# Patient Record
Sex: Male | Born: 1965
Health system: Southern US, Community
[De-identification: ages and names within clinical notes are randomized; demographics above are authoritative.]

## PROBLEM LIST (undated history)

## (undated) DIAGNOSIS — E559 Vitamin D deficiency, unspecified: Secondary | ICD-10-CM

## (undated) DIAGNOSIS — K635 Polyp of colon: Secondary | ICD-10-CM

## (undated) DIAGNOSIS — K4090 Unilateral inguinal hernia, without obstruction or gangrene, not specified as recurrent: Secondary | ICD-10-CM

## (undated) HISTORY — DX: Polyp of colon: K63.5

## (undated) HISTORY — DX: Vitamin D deficiency, unspecified: E55.9

## (undated) HISTORY — PX: INGUINAL HERNIA REPAIR: SUR1180

## (undated) HISTORY — PX: OTHER SURGICAL HISTORY: SHX169

## (undated) HISTORY — DX: Unilateral inguinal hernia, without obstruction or gangrene, not specified as recurrent: K40.90

---

## 2005-07-12 ENCOUNTER — Emergency Department: Payer: Self-pay | Admitting: Internal Medicine

## 2006-09-18 ENCOUNTER — Emergency Department: Payer: Self-pay | Admitting: Emergency Medicine

## 2017-05-16 ENCOUNTER — Telehealth: Payer: Self-pay | Admitting: *Deleted

## 2017-05-16 NOTE — Telephone Encounter (Signed)
Copied from [place] [id number]. Topic: Appointment Scheduling - New Patient >> May 16, 2017 10:54 AM Synthia Innocent wrote: New patient has been scheduled for your office. Provider: Dr Terese Door Date of Appointment: 07/01/17  Route to department's PEC pool.

## 2017-07-01 ENCOUNTER — Ambulatory Visit (INDEPENDENT_AMBULATORY_CARE_PROVIDER_SITE_OTHER): Payer: BLUE CROSS/BLUE SHIELD | Admitting: Internal Medicine

## 2017-07-01 ENCOUNTER — Encounter: Payer: Self-pay | Admitting: Internal Medicine

## 2017-07-01 VITALS — BP 136/80 | HR 100 | Temp 98.3°F | Ht 70.0 in | Wt 222.4 lb

## 2017-07-01 DIAGNOSIS — K4090 Unilateral inguinal hernia, without obstruction or gangrene, not specified as recurrent: Secondary | ICD-10-CM | POA: Diagnosis not present

## 2017-07-01 DIAGNOSIS — Z0000 Encounter for general adult medical examination without abnormal findings: Secondary | ICD-10-CM | POA: Diagnosis not present

## 2017-07-01 DIAGNOSIS — Z13220 Encounter for screening for lipoid disorders: Secondary | ICD-10-CM | POA: Diagnosis not present

## 2017-07-01 DIAGNOSIS — Z1389 Encounter for screening for other disorder: Secondary | ICD-10-CM

## 2017-07-01 DIAGNOSIS — Z1329 Encounter for screening for other suspected endocrine disorder: Secondary | ICD-10-CM | POA: Diagnosis not present

## 2017-07-01 DIAGNOSIS — Z125 Encounter for screening for malignant neoplasm of prostate: Secondary | ICD-10-CM

## 2017-07-01 NOTE — Progress Notes (Signed)
Pre visit review using our clinic review tool, if applicable. No additional management support is needed unless otherwise documented below in the visit note. 

## 2017-07-01 NOTE — Patient Instructions (Addendum)
Dr. Cheryln Manly [facility] Hernia  [place]   [phone]  [phone]  [facility] [id number] [place] [address]   Think about labs  CMET $21.99  CBC $14  Lipid $51.00  Urine $10 TSH $26  PSA $58    Hernia, Adult A hernia is the bulging of an organ or tissue through a weak spot in the muscles of the abdomen (abdominal wall). Hernias develop most often near the navel or groin. There are many kinds of hernias. Common kinds include:  Femoral hernia. This kind of hernia develops under the groin in the upper thigh area.  Inguinal hernia. This kind of hernia develops in the groin or scrotum.  Umbilical hernia. This kind of hernia develops near the navel.  Hiatal hernia. This kind of hernia causes part of the stomach to be pushed up into the chest.  Incisional hernia. This kind of hernia bulges through a scar from an abdominal surgery.  What are the causes? This condition may be caused by:  Heavy lifting.  Coughing over a long period of time.  Straining to have a bowel movement.  An incision made during an abdominal surgery.  A birth defect (congenital defect).  Excess weight or obesity.  Smoking.  Poor nutrition.  Cystic fibrosis.  Excess fluid in the abdomen.  Undescended testicles.  What are the signs or symptoms? Symptoms of a hernia include:  A lump on the abdomen. This is the first sign of a hernia. The lump may become more obvious with standing, straining, or coughing. It may get bigger over time if it is not treated or if the condition causing it is not treated.  Pain. A hernia is usually painless, but it may become painful over time if treatment is delayed. The pain is usually dull and may get worse with standing or lifting heavy objects.  Sometimes a hernia gets tightly squeezed in the weak spot (strangulated) or stuck there (incarcerated) and causes additional symptoms. These symptoms may  include:  Vomiting.  Nausea.  Constipation.  Irritability.  How is this diagnosed? A hernia may be diagnosed with:  A physical exam. During the exam your health care provider may ask you to cough or to make a specific movement, because a hernia is usually more visible when you move.  Imaging tests. These can include: ? X-rays. ? Ultrasound. ? CT scan.  How is this treated? A hernia that is small and painless may not need to be treated. A hernia that is large or painful may be treated with surgery. Inguinal hernias may be treated with surgery to prevent incarceration or strangulation. Strangulated hernias are always treated with surgery, because lack of blood to the trapped organ or tissue can cause it to die. Surgery to treat a hernia involves pushing the bulge back into place and repairing the weak part of the abdomen. Follow these instructions at home:  Avoid straining.  Do not lift anything heavier than 10 lb (4.5 kg).  Lift with your leg muscles, not your back muscles. This helps avoid strain.  When coughing, try to cough gently.  Prevent constipation. Constipation leads to straining with bowel movements, which can make a hernia worse or cause a hernia repair to break down. You can prevent constipation by: ? Eating a high-fiber diet that includes plenty of fruits and vegetables. ? Drinking enough fluids to keep your urine clear or pale yellow. Aim to drink 6-8  glasses of water per day. ? Using a stool softener as directed by your health care provider.  Lose weight, if you are overweight.  Do not use any tobacco products, including cigarettes, chewing tobacco, or electronic cigarettes. If you need help quitting, ask your health care provider.  Keep all follow-up visits as directed by your health care provider. This is important. Your health care provider may need to monitor your condition. Contact a health care provider if:  You have swelling, redness, and pain in the  affected area.  Your bowel habits change. Get help right away if:  You have a fever.  You have abdominal pain that is getting worse.  You feel nauseous or you vomit.  You cannot push the hernia back in place by gently pressing on it while you are lying down.  The hernia: ? Changes in shape or size. ? Is stuck outside the abdomen. ? Becomes discolored. ? Feels hard or tender. This information is not intended to replace advice given to you by your health care provider. Make sure you discuss any questions you have with your health care provider. Document Released: 02/25/2005 Document Revised: 07/26/2015 Document Reviewed: 01/05/2014 Elsevier Interactive Patient Education  2017 [place].   Colonoscopy, Adult A colonoscopy is an exam to look at the entire large intestine. During the exam, a lubricated, bendable tube is inserted into the anus and then passed into the rectum, colon, and other parts of the large intestine. A colonoscopy is often done as a part of normal colorectal screening or in response to certain symptoms, such as anemia, persistent diarrhea, abdominal pain, and blood in the stool. The exam can help screen for and diagnose medical problems, including:  Tumors.  Polyps.  Inflammation.  Areas of bleeding.  Tell a health care provider about:  Any allergies you have.  All medicines you are taking, including vitamins, herbs, eye drops, creams, and over-the-counter medicines.  Any problems you or family members have had with anesthetic medicines.  Any blood disorders you have.  Any surgeries you have had.  Any medical conditions you have.  Any problems you have had passing stool. What are the risks? Generally, this is a safe procedure. However, problems may occur, including:  Bleeding.  A tear in the intestine.  A reaction to medicines given during the exam.  Infection (rare).  What happens before the procedure? Eating and drinking  restrictions Follow instructions from your health care provider about eating and drinking, which may include:  A few days before the procedure - follow a low-fiber diet. Avoid nuts, seeds, dried fruit, raw fruits, and vegetables.  1-3 days before the procedure - follow a clear liquid diet. Drink only clear liquids, such as clear broth or bouillon, black coffee or tea, clear juice, clear soft drinks or sports drinks, gelatin dessert, and popsicles. Avoid any liquids that contain red or purple dye.  On the day of the procedure - do not eat or drink anything during the 2 hours before the procedure, or within the time period that your health care provider recommends.  Bowel prep If you were prescribed an oral bowel prep to clean out your colon:  Take it as told by your health care provider. Starting the day before your procedure, you will need to drink a large amount of medicated liquid. The liquid will cause you to have multiple loose stools until your stool is almost clear or light green.  If your skin or anus gets irritated from diarrhea,  you may use these to relieve the irritation: ? Medicated wipes, such as adult wet wipes with aloe and vitamin E. ? A skin soothing-product like petroleum jelly.  If you vomit while drinking the bowel prep, take a break for up to 60 minutes and then begin the bowel prep again. If vomiting continues and you cannot take the bowel prep without vomiting, call your health care provider.  General instructions  Ask your health care provider about changing or stopping your regular medicines. This is especially important if you are taking diabetes medicines or blood thinners.  Plan to have someone take you home from the hospital or clinic. What happens during the procedure?  An IV tube may be inserted into one of your veins.  You will be given medicine to help you relax (sedative).  To reduce your risk of infection: ? Your health care team will wash or sanitize  their hands. ? Your anal area will be washed with soap.  You will be asked to lie on your side with your knees bent.  Your health care provider will lubricate a long, thin, flexible tube. The tube will have a camera and a light on the end.  The tube will be inserted into your anus.  The tube will be gently eased through your rectum and colon.  Air will be delivered into your colon to keep it open. You may feel some pressure or cramping.  The camera will be used to take images during the procedure.  A small tissue sample may be removed from your body to be examined under a microscope (biopsy). If any potential problems are found, the tissue will be sent to a lab for testing.  If small polyps are found, your health care provider may remove them and have them checked for cancer cells.  The tube that was inserted into your anus will be slowly removed. The procedure may vary among health care providers and hospitals. What happens after the procedure?  Your blood pressure, heart rate, breathing rate, and blood oxygen level will be monitored until the medicines you were given have worn off.  Do not drive for 24 hours after the exam.  You may have a small amount of blood in your stool.  You may pass gas and have mild abdominal cramping or bloating due to the air that was used to inflate your colon during the exam.  It is up to you to get the results of your procedure. Ask your health care provider, or the department performing the procedure, when your results will be ready. This information is not intended to replace advice given to you by your health care provider. Make sure you discuss any questions you have with your health care provider. Document Released: 02/23/2000 Document Revised: 12/27/2015 Document Reviewed: 05/09/2015 Elsevier Interactive Patient Education  2018 [employer].

## 2017-07-01 NOTE — Progress Notes (Signed)
Chief Complaint  Patient presents with  . New Patient (Initial Visit)   New patient  He reports 1995 he had a bulge in the right groin s/p hernia repair with mesh now 6-7 month ago had bulge in left groin noted with heavy lifting and increasing in size and swollen. He feels gas moving through there with carbonated beverages and also hears gas in there. He wants referral to [facility] surgery for repair and funds are limited with insurance with high deductible   Review of Systems  Constitutional: Negative for weight loss.  HENT: Negative for hearing loss.   Eyes: Negative for blurred vision.  Respiratory: Negative for shortness of breath.   Cardiovascular: Negative for chest pain.  Musculoskeletal:       +hernia left  Skin: Negative for rash.  Neurological: Negative for headaches.  Psychiatric/Behavioral: Negative for depression.   Past Medical History:  Diagnosis Date  . Hernia, inguinal, right    Past Surgical History:  Procedure Laterality Date  . right inguinal hernia repair     1995   Family History  Problem Relation Age of Onset  . Arthritis Mother   . Depression Mother   . Cancer Maternal Grandfather    Social History   Socioeconomic History  . Marital status: Married    Spouse name: Not on file  . Number of children: Not on file  . Years of education: Not on file  . Highest education level: Not on file  Occupational History  . Not on file  Social Needs  . Financial resource strain: Not on file  . Food insecurity:    Worry: Not on file    Inability: Not on file  . Transportation needs:    Medical: Not on file    Non-medical: Not on file  Tobacco Use  . Smoking status: Never Smoker  . Smokeless tobacco: Never Used  Substance and Sexual Activity  . Alcohol use: Not Currently  . Drug use: Not Currently  . Sexual activity: Not Currently    Partners: Female  Lifestyle  . Physical activity:    Days per week: Not on file    Minutes per session: Not on file  .  Stress: Not on file  Relationships  . Social connections:    Talks on phone: Not on file    Gets together: Not on file    Attends religious service: Not on file    Active member of club or organization: Not on file    Attends meetings of clubs or organizations: Not on file    Relationship status: Not on file  . Intimate partner violence:    Fear of current or ex partner: Not on file    Emotionally abused: Not on file    Physically abused: Not on file    Forced sexual activity: Not on file  Other Topics Concern  . Not on file  Social History Narrative   College ed   Works Freight forwarder at [employer]    Married 1 adopted kid and triplets; 1 if daughters is Therapist, sports at [employer]    Never smoker/chew    Safe in relationship, wears seat belt    No guns    No outpatient medications have been marked as taking for the 07/01/17 encounter (Office Visit) with McLean-Scocuzza, Nino Glow, MD.   No Known Allergies No results found for this or any previous visit (from the past 2160 hour(s)). Objective  Body mass index is 31.91 kg/m. Wt Readings from Last  3 Encounters:  07/01/17 222 lb 6.4 oz (100.9 kg)   Temp Readings from Last 3 Encounters:  07/01/17 98.3 F (36.8 C) (Oral)   BP Readings from Last 3 Encounters:  07/01/17 136/80   Pulse Readings from Last 3 Encounters:  07/01/17 100    Physical Exam  Constitutional: He is oriented to person, place, and time. Vital signs are normal. He appears well-developed and well-nourished. He is cooperative.  HENT:  Head: Normocephalic and atraumatic.  Mouth/Throat: Oropharynx is clear and moist.  Eyes: Pupils are equal, round, and reactive to light. Conjunctivae are normal.  Cardiovascular: Normal rate, regular rhythm and normal heart sounds.  Pulmonary/Chest: Effort normal and breath sounds normal.  Abdominal:  +left groin inguinal hernia large   Neurological: He is alert and oriented to person, place, and time. Gait normal.  Skin: Skin is warm, dry  and intact.     Psychiatric: He has a normal mood and affect. His speech is normal and behavior is normal. Judgment and thought content normal. Cognition and memory are normal.  Nursing note and vitals reviewed.   Assessment   1. Left inguinal hernia  2. HM  Plan   1. Refer to Dr. Cheryln Manly [facility] hernia center  2.  Does not get flu shot  Tdap had 11/2008 Disc labs and given price list CMET, CBC, lipid, UA, TSH, PSA given cost  Disc colonoscopy not ready yet 2/2 cost  [facility]  Never smoker/chew     Provider: Dr. Olivia Mackie McLean-Scocuzza-Internal Medicine

## 2017-07-03 ENCOUNTER — Telehealth: Payer: Self-pay

## 2017-07-03 NOTE — Telephone Encounter (Signed)
Copied from [place]. Topic: Referral - Status >> Jul 03, 2017  2:40 PM Conception Chancy, NT wrote: Reason for CRM: patient is calling and states that the hernia clinic is needing the referral faxed to them FAX# [phone]

## 2017-07-04 NOTE — Telephone Encounter (Signed)
This was submitted to them through [facility] on 4/25

## 2017-07-07 ENCOUNTER — Telehealth: Payer: Self-pay

## 2017-07-07 NOTE — Telephone Encounter (Signed)
Referral has been refaxed to [facility] general surgery. Fax: [phone].  Copied from [place]. Topic: Referral - Status >> Jul 03, 2017  2:40 PM Conception Chancy, NT wrote: Reason for CRM: patient is calling and states that the hernia clinic is needing the referral faxed to them FAX# [phone]  >> Jul 07, 2017 11:13 AM Yvette Rack wrote: Patient states that the referral need to be faxed to the hernia clinic is  the referral fax to  [phone]  The clinic states that its quicker this way not through    This was submitted to them through [facility] link on 4/25

## 2017-07-15 ENCOUNTER — Telehealth: Payer: Self-pay

## 2017-07-15 NOTE — Telephone Encounter (Signed)
Copied from [place]. Topic: Referral - Status >> Jul 03, 2017  2:40 PM Conception Chancy, NT wrote: Reason for CRM: patient is calling and states that the hernia clinic is needing the referral faxed to them FAX# [phone]  >> Jul 07, 2017 11:13 AM Yvette Rack wrote: Patient states that the referral need to be faxed to the hernia clinic is  the referral fax to  [phone]  The clinic states that its quicker this way not through    This was submitted to them through [facility] link on 4/25                         Last office note has been faxed to number listed for [facility] hernia clinic  [phone]                 >> Jul 15, 2017  4:08 PM Vernona Rieger wrote: Patient states the hernia clinic in [place] called and said that they need History and Procedure report before they could see him. Please advise. Call back is [phone]

## 2017-11-17 ENCOUNTER — Other Ambulatory Visit: Payer: Self-pay

## 2017-11-17 ENCOUNTER — Ambulatory Visit (INDEPENDENT_AMBULATORY_CARE_PROVIDER_SITE_OTHER): Payer: BLUE CROSS/BLUE SHIELD | Admitting: Internal Medicine

## 2017-11-17 ENCOUNTER — Ambulatory Visit (INDEPENDENT_AMBULATORY_CARE_PROVIDER_SITE_OTHER): Payer: BLUE CROSS/BLUE SHIELD

## 2017-11-17 ENCOUNTER — Encounter: Payer: Self-pay | Admitting: Internal Medicine

## 2017-11-17 VITALS — BP 136/64 | HR 83 | Temp 98.3°F | Ht 70.0 in | Wt 217.0 lb

## 2017-11-17 DIAGNOSIS — Z1211 Encounter for screening for malignant neoplasm of colon: Secondary | ICD-10-CM

## 2017-11-17 DIAGNOSIS — E559 Vitamin D deficiency, unspecified: Secondary | ICD-10-CM

## 2017-11-17 DIAGNOSIS — Z0000 Encounter for general adult medical examination without abnormal findings: Secondary | ICD-10-CM

## 2017-11-17 DIAGNOSIS — Z1389 Encounter for screening for other disorder: Secondary | ICD-10-CM

## 2017-11-17 DIAGNOSIS — M549 Dorsalgia, unspecified: Secondary | ICD-10-CM

## 2017-11-17 DIAGNOSIS — Z125 Encounter for screening for malignant neoplasm of prostate: Secondary | ICD-10-CM

## 2017-11-17 DIAGNOSIS — Z13220 Encounter for screening for lipoid disorders: Secondary | ICD-10-CM

## 2017-11-17 DIAGNOSIS — Z0184 Encounter for antibody response examination: Secondary | ICD-10-CM

## 2017-11-17 DIAGNOSIS — Z1159 Encounter for screening for other viral diseases: Secondary | ICD-10-CM

## 2017-11-17 DIAGNOSIS — Z1329 Encounter for screening for other suspected endocrine disorder: Secondary | ICD-10-CM

## 2017-11-17 NOTE — Progress Notes (Signed)
Pre visit review using our clinic review tool, if applicable. No additional management support is needed unless otherwise documented below in the visit note. 

## 2017-11-17 NOTE — Progress Notes (Signed)
Chief Complaint  Patient presents with  . Back Pain   F/u  1. Mid back pain x 15 years intermittently but worse recently 6/10 nothing tried. He had fall 15 years ago and hit back middle but having trouble sleeping 2/2 pain no meds tried.  2. Wants colonoscopy   Review of Systems  Constitutional: Negative for weight loss.  HENT: Negative for hearing loss.   Eyes: Negative for blurred vision.  Respiratory: Negative for shortness of breath.   Cardiovascular: Negative for chest pain.  Gastrointestinal: Negative for abdominal pain.  Musculoskeletal: Positive for back pain.  Skin: Negative for rash.  Neurological: Negative for headaches.  Psychiatric/Behavioral: Negative for depression.   Past Medical History:  Diagnosis Date  . Hernia, inguinal, right    Past Surgical History:  Procedure Laterality Date  . right inguinal hernia repair     1995   Family History  Problem Relation Age of Onset  . Arthritis Mother   . Depression Mother   . Cancer Maternal Grandfather    Social History   Socioeconomic History  . Marital status: Married    Spouse name: Not on file  . Number of children: Not on file  . Years of education: Not on file  . Highest education level: Not on file  Occupational History  . Not on file  Social Needs  . Financial resource strain: Not on file  . Food insecurity:    Worry: Not on file    Inability: Not on file  . Transportation needs:    Medical: Not on file    Non-medical: Not on file  Tobacco Use  . Smoking status: Never Smoker  . Smokeless tobacco: Never Used  Substance and Sexual Activity  . Alcohol use: Not Currently  . Drug use: Not Currently  . Sexual activity: Not Currently    Partners: Female  Lifestyle  . Physical activity:    Days per week: Not on file    Minutes per session: Not on file  . Stress: Not on file  Relationships  . Social connections:    Talks on phone: Not on file    Gets together: Not on file    Attends religious  service: Not on file    Active member of club or organization: Not on file    Attends meetings of clubs or organizations: Not on file    Relationship status: Not on file  . Intimate partner violence:    Fear of current or ex partner: Not on file    Emotionally abused: Not on file    Physically abused: Not on file    Forced sexual activity: Not on file  Other Topics Concern  . Not on file  Social History Narrative   College ed   Works Freight forwarder at [employer]    Married 1 adopted kid and triplets; 1 if daughters is Therapist, sports at [employer]    Never smoker/chew    Safe in relationship, wears seat belt    No guns    No outpatient medications have been marked as taking for the 11/17/17 encounter (Office Visit) with McLean-Scocuzza, Nino Glow, MD.   No Known Allergies No results found for this or any previous visit (from the past 2160 hour(s)). Objective  Body mass index is 31.14 kg/m. Wt Readings from Last 3 Encounters:  11/17/17 217 lb (98.4 kg)  07/01/17 222 lb 6.4 oz (100.9 kg)   Temp Readings from Last 3 Encounters:  11/17/17 98.3 F (36.8 C) (Oral)  07/01/17 98.3 F (36.8 C) (Oral)   BP Readings from Last 3 Encounters:  11/17/17 136/64  07/01/17 136/80   Pulse Readings from Last 3 Encounters:  11/17/17 83  07/01/17 100    Physical Exam  Constitutional: He is oriented to person, place, and time. Vital signs are normal. He appears well-developed and well-nourished. He is cooperative.  HENT:  Head: Normocephalic and atraumatic.  Mouth/Throat: Oropharynx is clear and moist and mucous membranes are normal.  Eyes: Pupils are equal, round, and reactive to light. Conjunctivae are normal.  Cardiovascular: Normal rate, regular rhythm and normal heart sounds.  Pulmonary/Chest: Effort normal and breath sounds normal.  Musculoskeletal:       Thoracic back: He exhibits tenderness.  Neurological: He is alert and oriented to person, place, and time. Gait normal.  Skin: Skin is warm, dry and  intact.  Psychiatric: He has a normal mood and affect. His speech is normal and behavior is normal. Judgment and thought content normal. Cognition and memory are normal.  Nursing note and vitals reviewed.   Assessment   1. Mid back pain  2. HM Plan  1. Xray mid back  2.  Declines flu shot  Tdap due 11/2018  Will disc shingrix in future  Never smoker no etoh  Declines HIV   Referred colonoscopy Dr. Allen Norris pt prefers needs before 03/2018  Dermatology saw Dr. Raliegh Ip 4 years ago   Provider: Dr. Olivia Mackie McLean-Scocuzza-Internal Medicine

## 2017-11-17 NOTE — Patient Instructions (Addendum)
Dr. Rob Bunting colonoscopy  [facility]    Exercising to Lose Weight Exercising can help you to lose weight. In order to lose weight through exercise, you need to do vigorous-intensity exercise. You can tell that you are exercising with vigorous intensity if you are breathing very hard and fast and cannot hold a conversation while exercising. Moderate-intensity exercise helps to maintain your current weight. You can tell that you are exercising at a moderate level if you have a higher heart rate and faster breathing, but you are still able to hold a conversation. How often should I exercise? Choose an activity that you enjoy and set realistic goals. Your health care provider can help you to make an activity plan that works for you. Exercise regularly as directed by your health care provider. This may include:  Doing resistance training twice each week, such as: ? Push-ups. ? Sit-ups. ? Lifting weights. ? Using resistance bands.  Doing a given intensity of exercise for a given amount of time. Choose from these options: ? 150 minutes of moderate-intensity exercise every week. ? 75 minutes of vigorous-intensity exercise every week. ? A mix of moderate-intensity and vigorous-intensity exercise every week.  Children, pregnant women, people who are out of shape, people who are overweight, and older adults may need to consult a health care provider for individual recommendations. If you have any sort of medical condition, be sure to consult your health care provider before starting a new exercise program. What are some activities that can help me to lose weight?  Walking at a rate of at least 4.5 miles an hour.  Jogging or running at a rate of 5 miles per hour.  Biking at a rate of at least 10 miles per hour.  Lap swimming.  Roller-skating or in-line skating.  Cross-country skiing.  Vigorous competitive sports, such as football, basketball, and  soccer.  Jumping rope.  Aerobic dancing. How can I be more active in my day-to-day activities?  Use the stairs instead of the elevator.  Take a walk during your lunch break.  If you drive, park your car farther away from work or school.  If you take public transportation, get off one stop early and walk the rest of the way.  Make all of your phone calls while standing up and walking around.  Get up, stretch, and walk around every 30 minutes throughout the day. What guidelines should I follow while exercising?  Do not exercise so much that you hurt yourself, feel dizzy, or get very short of breath.  Consult your health care provider prior to starting a new exercise program.  Wear comfortable clothes and shoes with good support.  Drink plenty of water while you exercise to prevent dehydration or heat stroke. Body water is lost during exercise and must be replaced.  Work out until you breathe faster and your heart beats faster. This information is not intended to replace advice given to you by your health care provider. Make sure you discuss any questions you have with your health care provider. Document Released: 03/30/2010 Document Revised: 08/03/2015 Document Reviewed: 07/29/2013 Elsevier Interactive Patient Education  [employer].

## 2017-11-19 ENCOUNTER — Encounter: Payer: Self-pay | Admitting: Internal Medicine

## 2017-11-20 ENCOUNTER — Other Ambulatory Visit: Payer: Self-pay | Admitting: Internal Medicine

## 2017-11-20 DIAGNOSIS — M549 Dorsalgia, unspecified: Secondary | ICD-10-CM

## 2017-11-26 ENCOUNTER — Other Ambulatory Visit (INDEPENDENT_AMBULATORY_CARE_PROVIDER_SITE_OTHER): Payer: BLUE CROSS/BLUE SHIELD

## 2017-11-26 ENCOUNTER — Other Ambulatory Visit: Payer: Self-pay | Admitting: Internal Medicine

## 2017-11-26 DIAGNOSIS — E559 Vitamin D deficiency, unspecified: Secondary | ICD-10-CM

## 2017-11-26 DIAGNOSIS — Z0000 Encounter for general adult medical examination without abnormal findings: Secondary | ICD-10-CM | POA: Diagnosis not present

## 2017-11-26 DIAGNOSIS — Z1159 Encounter for screening for other viral diseases: Secondary | ICD-10-CM

## 2017-11-26 DIAGNOSIS — Z13220 Encounter for screening for lipoid disorders: Secondary | ICD-10-CM | POA: Diagnosis not present

## 2017-11-26 DIAGNOSIS — Z1329 Encounter for screening for other suspected endocrine disorder: Secondary | ICD-10-CM | POA: Diagnosis not present

## 2017-11-26 DIAGNOSIS — Z125 Encounter for screening for malignant neoplasm of prostate: Secondary | ICD-10-CM | POA: Diagnosis not present

## 2017-11-26 DIAGNOSIS — Z0184 Encounter for antibody response examination: Secondary | ICD-10-CM

## 2017-11-26 DIAGNOSIS — Z1389 Encounter for screening for other disorder: Secondary | ICD-10-CM

## 2017-11-26 DIAGNOSIS — R739 Hyperglycemia, unspecified: Secondary | ICD-10-CM

## 2017-11-26 LAB — CBC WITH DIFFERENTIAL/PLATELET
BASOS PCT: 0.6 % (ref 0.0–3.0)
Basophils Absolute: 0 10*3/uL (ref 0.0–0.1)
Eosinophils Absolute: 0.3 10*3/uL (ref 0.0–0.7)
Eosinophils Relative: 3.6 % (ref 0.0–5.0)
HEMATOCRIT: 45.3 % (ref 39.0–52.0)
Hemoglobin: 15.3 g/dL (ref 13.0–17.0)
LYMPHS PCT: 32 % (ref 12.0–46.0)
Lymphs Abs: 2.7 10*3/uL (ref 0.7–4.0)
MCHC: 33.8 g/dL (ref 30.0–36.0)
MCV: 89.2 fl (ref 78.0–100.0)
MONOS PCT: 9.4 % (ref 3.0–12.0)
Monocytes Absolute: 0.8 10*3/uL (ref 0.1–1.0)
NEUTROS ABS: 4.6 10*3/uL (ref 1.4–7.7)
Neutrophils Relative %: 54.4 % (ref 43.0–77.0)
PLATELETS: 286 10*3/uL (ref 150.0–400.0)
RBC: 5.07 Mil/uL (ref 4.22–5.81)
RDW: 13.4 % (ref 11.5–15.5)
WBC: 8.5 10*3/uL (ref 4.0–10.5)

## 2017-11-26 LAB — PSA: PSA: 0.48 ng/mL (ref 0.10–4.00)

## 2017-11-26 LAB — COMPREHENSIVE METABOLIC PANEL
ALBUMIN: 4.5 g/dL (ref 3.5–5.2)
ALT: 21 U/L (ref 0–53)
AST: 18 U/L (ref 0–37)
Alkaline Phosphatase: 73 U/L (ref 39–117)
BILIRUBIN TOTAL: 0.6 mg/dL (ref 0.2–1.2)
BUN: 21 mg/dL (ref 6–23)
CALCIUM: 9.7 mg/dL (ref 8.4–10.5)
CHLORIDE: 105 meq/L (ref 96–112)
CO2: 26 mEq/L (ref 19–32)
Creatinine, Ser: 1.08 mg/dL (ref 0.40–1.50)
GFR: 76.53 mL/min (ref >60.00)
Glucose, Bld: 109 mg/dL — ABNORMAL HIGH (ref 70–99)
Potassium: 4.2 mEq/L (ref 3.5–5.1)
Sodium: 139 mEq/L (ref 135–145)
Total Protein: 7.5 g/dL (ref 6.0–8.3)

## 2017-11-26 LAB — TSH: TSH: 3.49 u[IU]/mL (ref 0.35–4.50)

## 2017-11-26 LAB — LIPID PANEL
CHOLESTEROL: 207 mg/dL — AB (ref 0–200)
HDL: 33.8 mg/dL — ABNORMAL LOW (ref >39.00)
LDL Cholesterol: 140 mg/dL — ABNORMAL HIGH (ref 0–99)
NonHDL: 173.62
Total CHOL/HDL Ratio: 6
Triglycerides: 169 mg/dL — ABNORMAL HIGH (ref 0.0–149.0)
VLDL: 33.8 mg/dL (ref 0.0–40.0)

## 2017-11-26 LAB — VITAMIN D 25 HYDROXY (VIT D DEFICIENCY, FRACTURES): VITD: 17.28 ng/mL — AB (ref 30.00–100.00)

## 2017-11-26 LAB — T4, FREE: FREE T4: 0.76 ng/dL (ref 0.60–1.60)

## 2017-11-26 MED ORDER — CHOLECALCIFEROL 1.25 MG (50000 UT) PO CAPS: 50000.0000 [IU] | ORAL_CAPSULE | ORAL | 1 refills | Status: AC

## 2017-11-27 ENCOUNTER — Other Ambulatory Visit (INDEPENDENT_AMBULATORY_CARE_PROVIDER_SITE_OTHER): Payer: BLUE CROSS/BLUE SHIELD

## 2017-11-27 DIAGNOSIS — R739 Hyperglycemia, unspecified: Secondary | ICD-10-CM

## 2017-11-27 LAB — URINALYSIS, ROUTINE W REFLEX MICROSCOPIC
Bilirubin Urine: NEGATIVE
Glucose, UA: NEGATIVE
Hgb urine dipstick: NEGATIVE
Ketones, ur: NEGATIVE
LEUKOCYTES UA: NEGATIVE
NITRITE: NEGATIVE
Protein, ur: NEGATIVE
SPECIFIC GRAVITY, URINE: 1.026 (ref 1.001–1.03)

## 2017-11-27 LAB — MEASLES/MUMPS/RUBELLA IMMUNITY
Mumps IgG: <9 AU/mL — ABNORMAL LOW
RUBEOLA IGG: 107 [AU]/ml
Rubella: 32 index

## 2017-11-27 LAB — HEMOGLOBIN A1C: HEMOGLOBIN A1C: 6.2 % (ref 4.6–6.5)

## 2017-11-27 LAB — HEPATITIS B SURFACE ANTIBODY, QUANTITATIVE

## 2017-12-09 ENCOUNTER — Ambulatory Visit
Admission: RE | Admit: 2017-12-09 | Discharge: 2017-12-09 | Disposition: A | Discharge status: Home / Self Care | Payer: BLUE CROSS/BLUE SHIELD | Source: Ambulatory Visit | Attending: Internal Medicine | Admitting: Internal Medicine

## 2017-12-09 DIAGNOSIS — M546 Pain in thoracic spine: Secondary | ICD-10-CM | POA: Diagnosis present

## 2017-12-09 DIAGNOSIS — M549 Dorsalgia, unspecified: Secondary | ICD-10-CM

## 2017-12-09 MED ORDER — PROPOFOL 10 MG/ML IV BOLUS
INTRAVENOUS | Status: AC
  Filled 2017-12-09: qty 20

## 2017-12-23 ENCOUNTER — Encounter: Payer: Self-pay | Admitting: Anesthesiology

## 2017-12-23 ENCOUNTER — Encounter
Admission: RE | Disposition: A | Discharge status: Home / Self Care | Payer: Self-pay | Source: Ambulatory Visit | Attending: Gastroenterology

## 2017-12-23 ENCOUNTER — Ambulatory Visit: Payer: BLUE CROSS/BLUE SHIELD | Admitting: Anesthesiology

## 2017-12-23 ENCOUNTER — Ambulatory Visit
Admission: RE | Admit: 2017-12-23 | Discharge: 2017-12-23 | Disposition: A | Discharge status: Home / Self Care | Payer: BLUE CROSS/BLUE SHIELD | Source: Ambulatory Visit | Attending: Gastroenterology | Admitting: Gastroenterology

## 2017-12-23 DIAGNOSIS — K5730 Diverticulosis of large intestine without perforation or abscess without bleeding: Secondary | ICD-10-CM | POA: Insufficient documentation

## 2017-12-23 DIAGNOSIS — Z1211 Encounter for screening for malignant neoplasm of colon: Secondary | ICD-10-CM | POA: Diagnosis present

## 2017-12-23 DIAGNOSIS — D124 Benign neoplasm of descending colon: Secondary | ICD-10-CM

## 2017-12-23 HISTORY — PX: COLONOSCOPY WITH PROPOFOL: SHX5780

## 2017-12-23 SURGERY — COLONOSCOPY WITH PROPOFOL
Anesthesia: General

## 2017-12-23 MED ORDER — GLYCOPYRROLATE 0.2 MG/ML IJ SOLN
INTRAMUSCULAR | Status: DC | PRN
  Administered 2017-12-23: 0.2 mg via INTRAVENOUS

## 2017-12-23 MED ORDER — SODIUM CHLORIDE 0.9 % IV SOLN
INTRAVENOUS | Status: DC
  Administered 2017-12-23: 1000 mL via INTRAVENOUS

## 2017-12-23 MED ORDER — LIDOCAINE HCL (PF) 1 % IJ SOLN
2.0000 mL | Freq: Once | INTRAMUSCULAR | Status: AC
  Administered 2017-12-23: 0.3 mL via INTRADERMAL

## 2017-12-23 MED ORDER — PROPOFOL 500 MG/50ML IV EMUL
INTRAVENOUS | Status: DC | PRN
  Administered 2017-12-23: 200 ug/kg/min via INTRAVENOUS

## 2017-12-23 MED ORDER — LIDOCAINE HCL (PF) 1 % IJ SOLN
INTRAMUSCULAR | Status: AC
  Administered 2017-12-23: 0.3 mL via INTRADERMAL
  Filled 2017-12-23: qty 2

## 2017-12-23 MED ORDER — LIDOCAINE 2% (20 MG/ML) 5 ML SYRINGE
INTRAMUSCULAR | Status: DC | PRN
  Administered 2017-12-23: 50 mg via INTRAVENOUS

## 2017-12-23 MED ORDER — GLYCOPYRROLATE 0.2 MG/ML IJ SOLN
INTRAMUSCULAR | Status: AC
  Filled 2017-12-23: qty 1

## 2017-12-23 MED ORDER — PROPOFOL 10 MG/ML IV BOLUS
INTRAVENOUS | Status: DC | PRN
  Administered 2017-12-23: 10 mg via INTRAVENOUS
  Administered 2017-12-23: 90 mg via INTRAVENOUS
  Administered 2017-12-23: 50 mg via INTRAVENOUS

## 2017-12-23 MED ORDER — EPHEDRINE SULFATE 50 MG/ML IJ SOLN
INTRAMUSCULAR | Status: DC | PRN
  Administered 2017-12-23 (×2): 10 mg via INTRAVENOUS

## 2017-12-23 NOTE — Anesthesia Preprocedure Evaluation (Signed)
Anesthesia Evaluation  Patient identified by MRN, date of birth, ID band Patient awake    Reviewed: Allergy & Precautions, H&P , NPO status , Patient's Chart, lab work & pertinent test results  History of Anesthesia Complications Negative for: history of anesthetic complications  Airway Mallampati: III  TM Distance: <3 FB Neck ROM: full    Dental  (+) Chipped   Pulmonary neg pulmonary ROS, neg shortness of breath,           Cardiovascular Exercise Tolerance: Good (-) angina(-) Past MI and (-) DOE negative cardio ROS       Neuro/Psych negative neurological ROS  negative psych ROS   GI/Hepatic negative GI ROS, Neg liver ROS, neg GERD  ,  Endo/Other  negative endocrine ROS  Renal/GU negative Renal ROS  negative genitourinary   Musculoskeletal   Abdominal   Peds  Hematology negative hematology ROS (+)   Anesthesia Other Findings Past Medical History: No date: Hernia, inguinal, right  Past Surgical History: No date: INGUINAL HERNIA REPAIR     Comment:  left 08/29/17 Dr. Raina Mina  No date: right inguinal hernia repair     Comment:  1995  BMI    Body Mass Index:  28.70 kg/m      Reproductive/Obstetrics negative OB ROS                             Anesthesia Physical Anesthesia Plan  ASA: II  Anesthesia Plan: General   Post-op Pain Management:    Induction: Intravenous  PONV Risk Score and Plan: Propofol infusion and TIVA  Airway Management Planned: Natural Airway and Nasal Cannula  Additional Equipment:   Intra-op Plan:   Post-operative Plan:   Informed Consent: I have reviewed the patients History and Physical, chart, labs and discussed the procedure including the risks, benefits and alternatives for the proposed anesthesia with the patient or authorized representative who has indicated his/her understanding and acceptance.   Dental Advisory Given  Plan Discussed  with: Anesthesiologist, CRNA and Surgeon  Anesthesia Plan Comments: (Patient consented for risks of anesthesia including but not limited to:  - adverse reactions to medications - risk of intubation if required - damage to teeth, lips or other oral mucosa - sore throat or hoarseness - Damage to heart, brain, lungs or loss of life  Patient voiced understanding.)        Anesthesia Quick Evaluation

## 2017-12-23 NOTE — H&P (Signed)
Corey Lame, MD [address] [place], [place] [address] Phone: [phone] Fax : [phone]  Primary Care Physician:  McLean-Scocuzza, Nino Glow, MD Primary Gastroenterologist:  Dr. Allen Norris  Pre-Procedure History & Physical: HPI:  Corey Sanders is a 52 y.o. male is here for a screening colonoscopy.   Past Medical History:  Diagnosis Date  . Hernia, inguinal, right     Past Surgical History:  Procedure Laterality Date  . INGUINAL HERNIA REPAIR     left 08/29/17 Dr. Raina Mina   . right inguinal hernia repair     1995    Prior to Admission medications   Medication Sig Start Date End Date Taking? Authorizing Provider  Cholecalciferol 50000 units capsule Take 1 capsule (50,000 Units total) by mouth once a week. 11/26/17   McLean-Scocuzza, Nino Glow, MD    Allergies as of 11/18/2017  . (No Known Allergies)    Family History  Problem Relation Age of Onset  . Arthritis Mother   . Depression Mother   . Cancer Maternal Grandfather     Social History   Socioeconomic History  . Marital status: Married    Spouse name: Not on file  . Number of children: Not on file  . Years of education: Not on file  . Highest education level: Not on file  Occupational History  . Not on file  Social Needs  . Financial resource strain: Not on file  . Food insecurity:    Worry: Not on file    Inability: Not on file  . Transportation needs:    Medical: Not on file    Non-medical: Not on file  Tobacco Use  . Smoking status: Never Smoker  . Smokeless tobacco: Never Used  Substance and Sexual Activity  . Alcohol use: Not Currently  . Drug use: Not Currently  . Sexual activity: Not Currently    Partners: Female  Lifestyle  . Physical activity:    Days per week: Not on file    Minutes per session: Not on file  . Stress: Not on file  Relationships  . Social connections:    Talks on phone: Not on file    Gets together: Not on file    Attends religious service: Not on  file    Active member of club or organization: Not on file    Attends meetings of clubs or organizations: Not on file    Relationship status: Not on file  . Intimate partner violence:    Fear of current or ex partner: Not on file    Emotionally abused: Not on file    Physically abused: Not on file    Forced sexual activity: Not on file  Other Topics Concern  . Not on file  Social History Narrative   College ed   Works Freight forwarder at [employer]    Married 1 adopted kid and triplets; 1 if daughters is Therapist, sports at [employer]    Never smoker/chew    Safe in relationship, wears seat belt    No guns     Review of Systems: See HPI, otherwise negative ROS  Physical Exam: BP 134/88   Pulse 73   Temp (!) 96.7 F (35.9 C) (Tympanic)   Resp 17   Ht 5\' 10"  (1.778 m)   Wt 90.7 kg   SpO2 100%   BMI 28.70 kg/m  General:   Alert,  pleasant and cooperative in NAD Head:  Normocephalic and atraumatic. Neck:  Supple; no masses  or thyromegaly. Lungs:  Clear throughout to auscultation.    Heart:  Regular rate and rhythm. Abdomen:  Soft, nontender and nondistended. Normal bowel sounds, without guarding, and without rebound.   Neurologic:  Alert and  oriented x4;  grossly normal neurologically.  Impression/Plan: Corey Sanders is now here to undergo a screening colonoscopy.  Risks, benefits, and alternatives regarding colonoscopy have been reviewed with the patient.  Questions have been answered.  All parties agreeable.

## 2017-12-23 NOTE — Op Note (Signed)
[facility] Gastroenterology Patient Name: Corey Sanders Procedure Date: 12/23/2017 10:02 AM MRN: [id number] Account #: [id number] Date of Birth: 1965/11/21 Admit Type: Outpatient Age: 52 Room: [facility] ENDO ROOM 4 Gender: Male Note Status: Finalized Procedure:            Colonoscopy Indications:          Screening for colorectal malignant neoplasm Providers:            [name] MD, MD Referring MD:         Nino Glow Mclean-Scocuzza MD, MD (Referring MD) Medicines:            Propofol per Anesthesia Complications:        No immediate complications. Procedure:            Pre-Anesthesia Assessment:                       - Prior to the procedure, a History and Physical was                        performed, and patient medications and allergies were                        reviewed. The patient's tolerance of previous                        anesthesia was also reviewed. The risks and benefits of                        the procedure and the sedation options and risks were                        discussed with the patient. All questions were                        answered, and informed consent was obtained. Prior                        Anticoagulants: The patient has taken no previous                        anticoagulant or antiplatelet agents. ASA Grade                        Assessment: II - A patient with mild systemic disease.                        After reviewing the risks and benefits, the patient was                        deemed in satisfactory condition to undergo the                        procedure.                       After obtaining informed consent, the colonoscope was                        passed under direct vision. Throughout the procedure,  the patient's blood pressure, pulse, and oxygen                        saturations were monitored continuously. The                        Colonoscope was introduced through the anus and                      advanced to the the cecum, identified by appendiceal                        orifice and ileocecal valve. The colonoscopy was                        performed without difficulty. The patient tolerated the                        procedure well. The quality of the bowel preparation                        was excellent. Findings:      The perianal and digital rectal examinations were normal.      A 5 mm polyp was found in the descending colon. The polyp was sessile.       The polyp was removed with a cold snare. Resection and retrieval were       complete.      A few small-mouthed diverticula were found in the sigmoid colon. Impression:           - One 5 mm polyp in the descending colon, removed with                        a cold snare. Resected and retrieved.                       - Diverticulosis in the sigmoid colon. Recommendation:       - Discharge patient to home.                       - Resume previous diet.                       - Continue present medications.                       - Await pathology results.                       - Repeat colonoscopy in 5 years if polyp adenoma and 10                        years if hyperplastic Procedure Code(s):    --- Professional ---                       [phone], Colonoscopy, flexible; with removal of tumor(s),                        polyp(s), or other lesion(s) by snare technique Diagnosis Code(s):    --- Professional ---  Z12.11, Encounter for screening for malignant neoplasm                        of colon                       D12.4, Benign neoplasm of descending colon CPT copyright 2018 American Medical Association. All rights reserved. The codes documented in this report are preliminary and upon coder review may  be revised to meet current compliance requirements. [name] MD, MD 12/23/2017 10:21:52 AM This report has been signed electronically. Number of Addenda: 0 Note Initiated On:  12/23/2017 10:02 AM Scope Withdrawal Time: 0 hours 6 minutes 42 seconds  Total Procedure Duration: 0 hours 10 minutes 39 seconds       [facility]

## 2017-12-23 NOTE — Anesthesia Postprocedure Evaluation (Signed)
Anesthesia Post Note  Patient: Ezzie Dural  Procedure(s) Performed: COLONOSCOPY WITH PROPOFOL (N/A )  Patient location during evaluation: Endoscopy Anesthesia Type: General Level of consciousness: awake and alert Pain management: pain level controlled Vital Signs Assessment: post-procedure vital signs reviewed and stable Respiratory status: spontaneous breathing, nonlabored ventilation, respiratory function stable and patient connected to nasal cannula oxygen Cardiovascular status: blood pressure returned to baseline and stable Postop Assessment: no apparent nausea or vomiting Anesthetic complications: no     Last Vitals:  Vitals:   12/23/17 1040 12/23/17 1050  BP: 97/62 103/64  Pulse: 70 71  Resp: 12 13  Temp:    SpO2: 97% 99%    Last Pain:  Vitals:   12/23/17 1020  TempSrc: Tympanic  PainSc:                  Precious Haws Severiano Utsey

## 2017-12-23 NOTE — Anesthesia Post-op Follow-up Note (Signed)
Anesthesia QCDR form completed.        

## 2017-12-23 NOTE — Transfer of Care (Signed)
Immediate Anesthesia Transfer of Care Note  Patient: Corey Sanders  Procedure(s) Performed: COLONOSCOPY WITH PROPOFOL (N/A )  Patient Location: Endoscopy Unit  Anesthesia Type:General  Level of Consciousness: awake and alert   Airway & Oxygen Therapy: Patient connected to nasal cannula oxygen  Post-op Assessment: Post -op Vital signs reviewed and stable  Post vital signs: stable  Last Vitals:  Vitals Value Taken Time  BP 101/64 12/23/2017 10:28 AM  Temp 36.1 C 12/23/2017 10:20 AM  Pulse 81 12/23/2017 10:29 AM  Resp 15 12/23/2017 10:29 AM  SpO2 99 % 12/23/2017 10:29 AM  Vitals shown include unvalidated device data.  Last Pain:  Vitals:   12/23/17 1020  TempSrc: Tympanic  PainSc:          Complications: No apparent anesthesia complications

## 2017-12-24 LAB — SURGICAL PATHOLOGY

## 2017-12-25 ENCOUNTER — Encounter: Payer: Self-pay | Admitting: Gastroenterology

## 2018-01-01 ENCOUNTER — Ambulatory Visit (INDEPENDENT_AMBULATORY_CARE_PROVIDER_SITE_OTHER): Payer: BLUE CROSS/BLUE SHIELD | Admitting: Internal Medicine

## 2018-01-01 ENCOUNTER — Encounter: Payer: Self-pay | Admitting: Internal Medicine

## 2018-01-01 VITALS — BP 124/88 | HR 85 | Temp 98.4°F | Resp 16 | Ht 70.0 in | Wt 210.5 lb

## 2018-01-01 DIAGNOSIS — M549 Dorsalgia, unspecified: Secondary | ICD-10-CM | POA: Diagnosis not present

## 2018-01-01 DIAGNOSIS — D124 Benign neoplasm of descending colon: Secondary | ICD-10-CM

## 2018-01-01 DIAGNOSIS — E559 Vitamin D deficiency, unspecified: Secondary | ICD-10-CM

## 2018-01-01 NOTE — Patient Instructions (Addendum)
After 05/2018 5000 vitamin D3   Results for Corey Sanders, Corey Sanders (MRN [id number]) as of 01/01/2018 15:59  Ref. Range 11/26/2017 08:18  Rubella Latest Units: index 32.00  Hepatitis B-Post Latest Ref Range: > OR = 10 mIU/mL <5 (L)  Mumps IgG Latest Units: AU/mL <9.00 (L)  Rubeola IgG Latest Units: AU/mL 107.00   Call health dept [phone] or your pharmacy    Cholesterol (can try Flaxseed or Chia Seeds)   Cholesterol is a white, waxy, fat-like substance that is needed by the human body in small amounts. The liver makes all the cholesterol we need. Cholesterol is carried from the liver by the blood through the blood vessels. Deposits of cholesterol (plaques) may build up on blood vessel (artery) walls. Plaques make the arteries narrower and stiffer. Cholesterol plaques increase the risk for heart attack and stroke. You cannot feel your cholesterol level even if it is very high. The only way to know that it is high is to have a blood test. Once you know your cholesterol levels, you should keep a record of the test results. Work with your health care provider to keep your levels in the desired range. What do the results mean?  Total cholesterol is a rough measure of all the cholesterol in your blood.  LDL (low-density lipoprotein) is the "bad" cholesterol. This is the type that causes plaque to build up on the artery walls. You want this level to be low.  HDL (high-density lipoprotein) is the "good" cholesterol because it cleans the arteries and carries the LDL away. You want this level to be high.  Triglycerides are fat that the body can either burn for energy or store. High levels are closely linked to heart disease. What are the desired levels of cholesterol?  Total cholesterol below 200.  LDL below 100 for people who are at risk, below 70 for people at very high risk.  HDL above 40 is good. A level of 60 or higher is considered to be protective against heart disease.  Triglycerides below  150. How can I lower my cholesterol? Diet Follow your diet program as told by your health care provider.  Choose fish or white meat chicken and [place], roasted or baked. Limit fatty cuts of red meat, fried foods, and processed meats, such as sausage and lunch meats.  Eat lots of fresh fruits and vegetables.  Choose whole grains, beans, pasta, potatoes, and cereals.  Choose olive oil, corn oil, or canola oil, and use only small amounts.  Avoid butter, mayonnaise, shortening, or palm kernel oils.  Avoid foods with trans fats.  Drink skim or nonfat milk and eat low-fat or nonfat yogurt and cheeses. Avoid whole milk, cream, ice cream, egg yolks, and full-fat cheeses.  Healthier desserts include angel food cake, ginger snaps, animal crackers, hard candy, popsicles, and low-fat or nonfat frozen yogurt. Avoid pastries, cakes, pies, and cookies.  Exercise  Follow your exercise program as told by your health care provider. A regular program: ? Helps to decrease LDL and raise HDL. ? Helps with weight control.  Do things that increase your activity level, such as gardening, walking, and taking the stairs.  Ask your health care provider about ways that you can be more active in your daily life.  Medicine  Take over-the-counter and prescription medicines only as told by your health care provider. ? Medicine may be prescribed by your health care provider to help lower cholesterol and decrease the risk for heart disease. This is  usually done if diet and exercise have failed to bring down cholesterol levels. ? If you have several risk factors, you may need medicine even if your levels are normal.  This information is not intended to replace advice given to you by your health care provider. Make sure you discuss any questions you have with your health care provider. Document Released: 11/20/2000 Document Revised: 09/23/2015 Document Reviewed: 08/26/2015 Elsevier Interactive Patient Education   2018 [employer].   MMR (Measles, Mumps and Rubella) Vaccine: What You Need to Know 1. Why get vaccinated? Measles, mumps, and rubella are viral diseases that can have serious consequences. Before vaccines, these diseases were very common in the [place], especially among children. They are still common in many parts of the world. Measles  Measles virus causes symptoms that can include fever, cough, runny nose, and red, watery eyes, commonly followed by a rash that covers the whole body.  Measles can lead to ear infections, diarrhea, and infection of the lungs (pneumonia). Rarely, measles can cause brain damage or death. Mumps  Mumps virus causes fever, headache, muscle aches, tiredness, loss of appetite, and swollen and tender salivary glands under the ears on one or both sides.  Mumps can lead to deafness, swelling of the brain and/or spinal cord covering (encephalitis or meningitis), painful swelling of the testicles or ovaries, and, very rarely, death. Rubella (also known as [place] Measles)  Rubella virus causes fever, sore throat, rash, headache, and eye irritation.  Rubella can cause arthritis in up to half of teenage and adult women.  If a woman gets rubella while she is pregnant, she could have a miscarriage or her baby could be born with serious birth defects. These diseases can easily spread from person to person. Measles doesn't even require personal contact. You can get measles by entering a room that a person with measles left up to 2 hours before. Vaccines and high rates of vaccination have made these diseases much less common in the [place]. 2. MMR vaccine Children should get 2 doses of MMR vaccine, usually:  First dose: 12 through 79 months of age  Second dose: 14 through 51 years of age  61 who will be traveling outside the [place] when they are between 15 and 69 months of age should get a dose of MMR vaccine before travel. This can provide  temporary protection from measles infection, but will not give permanent immunity. The child should still get 2 doses at the recommended ages for long-lasting protection. Adults might also need MMR vaccine. Many adults 22 years of age and older might be susceptible to measles, mumps, and rubella without knowing it. A third dose of MMR might be recommended in certain mumps outbreak situations. There are no known risks to getting MMR vaccine at the same time as other vaccines. There is a combination vaccine called MMRV that contains both chickenpox and MMR vaccines. MMRV is an option for some children 12 months through 31 years of age. There is a separate Vaccine Information Statement for MMRV. Your health care provider can give you more information. 3. Some people should not get this vaccine Tell your vaccine provider if the person getting the vaccine:  Has any severe, life-threatening allergies. A person who has ever had a life-threatening allergic reaction after a dose of MMR vaccine, or has a severe allergy to any part of this vaccine, may be advised not to be vaccinated. Ask your health care provider if you want information about vaccine  components.  Is pregnant, or thinks she might be pregnant. Pregnant women should wait to get MMR vaccine until after they are no longer pregnant. Women should avoid getting pregnant for at least 1 month after getting MMR vaccine.  Has a weakened immune system due to disease (such as cancer or HIV/AIDS) or medical treatments (such as radiation, immunotherapy, steroids, or chemotherapy).  Has a parent, brother, or sister with a history of immune system problems.  Has ever had a condition that makes them bruise or bleed easily.  Has recently had a blood transfusion or received other blood products. You might be advised to postpone MMR vaccination for 3 months or more.  Has tuberculosis.  Has gotten any other vaccines in the past 4 weeks. Live vaccines given too  close together might not work as well.  Is not feeling well. A mild illness, such as a cold, is usually not a reason to postpone a vaccination. Someone who is moderately or severely ill should probably wait. Your doctor can advise you.  4. Risks of a vaccine reaction With any medicine, including vaccines, there is a chance of reactions. These are usually mild and go away on their own, but serious reactions are also possible. Getting MMR vaccine is much safer than getting measles, mumps, or rubella disease. Most people who get MMR vaccine do not have any problems with it. After MMR vaccination, a person might experience: Minor events:  Sore arm from the injection  Fever  Redness or rash at the injection site  Swelling of glands in the cheeks or neck If these events happen, they usually begin within 2 weeks after the shot. They occur less often after the second dose. Moderate events:  Seizure (jerking or staring) often associated with fever  Temporary pain and stiffness in the joints, mostly in teenage or adult women  Temporary low platelet count, which can cause unusual bleeding or bruising  Rash all over body Severe events occur very rarely:  Deafness  Long-term seizures, coma, or lowered consciousness  Brain damage Other things that could happen after this vaccine:  People sometimes faint after medical procedures, including vaccination. Sitting or lying down for about 15 minutes can help prevent fainting and injuries caused by a fall. Tell your provider if you feel dizzy or have vision changes or ringing in the ears.  Some people get shoulder pain that can be more severe and longer-lasting than routine soreness that can follow injections. This happens very rarely.  Any medication can cause a severe allergic reaction. Such reactions to a vaccine are estimated at about 1 in a million doses, and would happen within a few minutes to a few hours after the vaccination. As with any  medicine, there is a very remote chance of a vaccine causing a serious injury or death. The safety of vaccines is always being monitored. For more information, visit: http://www.aguilar.org/ 5. What if there is a serious problem? What should I look for?  Look for anything that concerns you, such as signs of a severe allergic reaction, very high fever, or unusual behavior. Signs of a severe allergic reaction can include hives, swelling of the face and throat, difficulty breathing, a fast heartbeat, dizziness, and weakness. These would usually start a few minutes to a few hours after the vaccination. What should I do?  If you think it is a severe allergic reaction or other emergency that can't wait, call 9-1-1 and get to the nearest hospital. Otherwise, call your health care  provider.  Afterward, the reaction should be reported to the Vaccine Adverse Event Reporting System (VAERS). Your doctor should file this report, or you can do it yourself through the VAERS web site at www.vaers.SamedayNews.es, or by calling (671)557-3601. ? VAERS does not give medical advice. 6. The National Vaccine Injury Compensation Program The [employer] Vaccine Injury Compensation Program (VICP) is a federal program that was created to compensate people who may have been injured by certain vaccines. Persons who believe they may have been injured by a vaccine can learn about the program and about filing a claim by calling 331-378-2331 or visiting the [place] website at GoldCloset.com.ee. There is a time limit to file a claim for compensation. 7. How can I learn more?  Ask your healthcare provider. He or she can give you the vaccine package insert or suggest other sources of information.  Call your local or state health department.  Contact the Centers for Disease Control and Prevention (CDC): ? Call 941-589-8780 (1-800-CDC-INFO)  or ? Visit CDC's website at http://hunter.com/ CDC Vaccine Information  Statement (VIS) MMR Vaccine (04/22/2016) This information is not intended to replace advice given to you by your health care provider. Make sure you discuss any questions you have with your health care provider. Document Released: 12/23/2005 Document Revised: 05/07/2016 Document Reviewed: 05/07/2016 Elsevier Interactive Patient Education  2018 [employer].  Hepatitis B Vaccine, Recombinant injection What is this medicine? HEPATITIS B VACCINE (hep uh TAHY tis B VAK seen) is a vaccine. It is used to prevent an infection with the hepatitis B virus. This medicine may be used for other purposes; ask your health care provider or pharmacist if you have questions. COMMON BRAND NAME(S): Engerix-B, Recombivax HB What should I tell my health care provider before I take this medicine? They need to know if you have any of these conditions: -fever, infection -heart disease -hepatitis B infection -immune system problems -kidney disease -an unusual or allergic reaction to vaccines, yeast, other medicines, foods, dyes, or preservatives -pregnant or trying to get pregnant -breast-feeding How should I use this medicine? This vaccine is for injection into a muscle. It is given by a health care professional. A copy of Vaccine Information Statements will be given before each vaccination. Read this sheet carefully each time. The sheet may change frequently. Talk to your pediatrician regarding the use of this medicine in children. While this drug may be prescribed for children as young as newborn for selected conditions, precautions do apply. Overdosage: If you think you have taken too much of this medicine contact a poison control center or emergency room at once. NOTE: This medicine is only for you. Do not share this medicine with others. What if I miss a dose? It is important not to miss your dose. Call your doctor or health care professional if you are unable to keep an appointment. What may interact with  this medicine? -medicines that suppress your immune function like adalimumab, anakinra, infliximab -medicines to treat cancer -steroid medicines like prednisone or cortisone This list may not describe all possible interactions. Give your health care provider a list of all the medicines, herbs, non-prescription drugs, or dietary supplements you use. Also tell them if you smoke, drink alcohol, or use illegal drugs. Some items may interact with your medicine. What should I watch for while using this medicine? See your health care provider for all shots of this vaccine as directed. You must have 3 shots of this vaccine for protection from hepatitis B infection. Tell your  doctor right away if you have any serious or unusual side effects after getting this vaccine. What side effects may I notice from receiving this medicine? Side effects that you should report to your doctor or health care professional as soon as possible: -allergic reactions like skin rash, itching or hives, swelling of the face, lips, or tongue -breathing problems -confused, irritated -fast, irregular heartbeat -flu-like syndrome -numb, tingling pain -seizures -unusually weak or tired Side effects that usually do not require medical attention (report to your doctor or health care professional if they continue or are bothersome): -diarrhea -fever -headache -loss of appetite -muscle pain -nausea -pain, redness, swelling, or irritation at site where injected -tiredness This list may not describe all possible side effects. Call your doctor for medical advice about side effects. You may report side effects to FDA at 1-800-FDA-1088. Where should I keep my medicine? This drug is given in a hospital or clinic and will not be stored at home. NOTE: This sheet is a summary. It may not cover all possible information. If you have questions about this medicine, talk to your doctor, pharmacist, or health care provider.  2018  Elsevier/Gold Standard (2013-06-28 13:26:01)   Exercising to Lose Weight Exercising can help you to lose weight. In order to lose weight through exercise, you need to do vigorous-intensity exercise. You can tell that you are exercising with vigorous intensity if you are breathing very hard and fast and cannot hold a conversation while exercising. Moderate-intensity exercise helps to maintain your current weight. You can tell that you are exercising at a moderate level if you have a higher heart rate and faster breathing, but you are still able to hold a conversation. How often should I exercise? Choose an activity that you enjoy and set realistic goals. Your health care provider can help you to make an activity plan that works for you. Exercise regularly as directed by your health care provider. This may include:  Doing resistance training twice each week, such as: ? Push-ups. ? Sit-ups. ? Lifting weights. ? Using resistance bands.  Doing a given intensity of exercise for a given amount of time. Choose from these options: ? 150 minutes of moderate-intensity exercise every week. ? 75 minutes of vigorous-intensity exercise every week. ? A mix of moderate-intensity and vigorous-intensity exercise every week.  Children, pregnant women, people who are out of shape, people who are overweight, and older adults may need to consult a health care provider for individual recommendations. If you have any sort of medical condition, be sure to consult your health care provider before starting a new exercise program. What are some activities that can help me to lose weight?  Walking at a rate of at least 4.5 miles an hour.  Jogging or running at a rate of 5 miles per hour.  Biking at a rate of at least 10 miles per hour.  Lap swimming.  Roller-skating or in-line skating.  Cross-country skiing.  Vigorous competitive sports, such as football, basketball, and soccer.  Jumping rope.  Aerobic  dancing. How can I be more active in my day-to-day activities?  Use the stairs instead of the elevator.  Take a walk during your lunch break.  If you drive, park your car farther away from work or school.  If you take public transportation, get off one stop early and walk the rest of the way.  Make all of your phone calls while standing up and walking around.  Get up, stretch, and walk around every  30 minutes throughout the day. What guidelines should I follow while exercising?  Do not exercise so much that you hurt yourself, feel dizzy, or get very short of breath.  Consult your health care provider prior to starting a new exercise program.  Wear comfortable clothes and shoes with good support.  Drink plenty of water while you exercise to prevent dehydration or heat stroke. Body water is lost during exercise and must be replaced.  Work out until you breathe faster and your heart beats faster. This information is not intended to replace advice given to you by your health care provider. Make sure you discuss any questions you have with your health care provider. Document Released: 03/30/2010 Document Revised: 08/03/2015 Document Reviewed: 07/29/2013 Elsevier Interactive Patient Education  2018 [employer].   Vitamin D Deficiency Vitamin D deficiency is when your body does not have enough vitamin D. Vitamin D is important to your body for many reasons:  It helps the body to absorb two important minerals, called calcium and phosphorus.  It plays a role in bone health.  It may help to prevent some diseases, such as diabetes and multiple sclerosis.  It plays a role in muscle function, including heart function.  You can get vitamin D by:  Eating foods that naturally contain vitamin D.  Eating or drinking milk or other dairy products that have vitamin D added to them.  Taking a vitamin D supplement or a multivitamin supplement that contains vitamin D.  Being in the sun.  Your body naturally makes vitamin D when your skin is exposed to sunlight. Your body changes the sunlight into a form of the vitamin that the body can use.  If vitamin D deficiency is severe, it can cause a condition in which your bones become soft. In adults, this condition is called osteomalacia. In children, this condition is called rickets. What are the causes? Vitamin D deficiency may be caused by:  Not eating enough foods that contain vitamin D.  Not getting enough sun exposure.  Having certain digestive system diseases that make it difficult for your body to absorb vitamin D. These diseases include Crohn disease, chronic pancreatitis, and cystic fibrosis.  Having a surgery in which a part of the stomach or a part of the small intestine is removed.  Being obese.  Having chronic kidney disease or liver disease.  What increases the risk? This condition is more likely to develop in:  Older people.  People who do not spend much time outdoors.  People who live in a long-term care facility.  People who have had broken bones.  People with weak or thin bones (osteoporosis).  People who have a disease or condition that changes how the body absorbs vitamin D.  People who have dark skin.  People who take certain medicines, such as steroid medicines or certain seizure medicines.  People who are overweight or obese.  What are the signs or symptoms? In mild cases of vitamin D deficiency, there may not be any symptoms. If the condition is severe, symptoms may include:  Bone pain.  Muscle pain.  Falling often.  Broken bones caused by a minor injury.  How is this diagnosed? This condition is usually diagnosed with a blood test. How is this treated? Treatment for this condition may depend on what caused the condition. Treatment options include:  Taking vitamin D supplements.  Taking a calcium supplement. Your health care provider will suggest what dose is best for  you.  Follow these  instructions at home:  Take medicines and supplements only as told by your health care provider.  Eat foods that contain vitamin D. Choices include: ? Fortified dairy products, cereals, or juices. Fortified means that vitamin D has been added to the food. Check the label on the package to be sure. ? Fatty fish, such as salmon or trout. ? Eggs. ? Oysters.  Do not use a tanning bed.  Maintain a healthy weight. Lose weight, if needed.  Keep all follow-up visits as told by your health care provider. This is important. Contact a health care provider if:  Your symptoms do not go away.  You feel like throwing up (nausea) or you throw up (vomit).  You have fewer bowel movements than usual or it is difficult for you to have a bowel movement (constipation). This information is not intended to replace advice given to you by your health care provider. Make sure you discuss any questions you have with your health care provider. Document Released: 05/20/2011 Document Revised: 08/09/2015 Document Reviewed: 07/13/2014 Elsevier Interactive Patient Education  2018 [employer].
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
## 2018-01-20 ENCOUNTER — Ambulatory Visit: Payer: BLUE CROSS/BLUE SHIELD | Admitting: Internal Medicine

## 2019-01-05 ENCOUNTER — Ambulatory Visit: Payer: BLUE CROSS/BLUE SHIELD | Admitting: Internal Medicine

## 2019-05-22 ENCOUNTER — Ambulatory Visit: Payer: BLUE CROSS/BLUE SHIELD

## 2019-12-20 IMAGING — MR MR THORACIC SPINE W/O CM
6 series · 30 of 48 positions shown · non-contrast
Comparison: Prior radiographs from 11/17/2016.

CLINICAL DATA: Initial evaluation for mid back pain for 2 years.

EXAM:
MRI THORACIC SPINE WITHOUT CONTRAST
TECHNIQUE: Multiplanar, multisequence MR imaging of the thoracic spine was
performed. No intravenous contrast was administered.

[Series 16: T1 · sagittal · 5.0mm · 1.88mm/px · 2 of 9 slices shown (1 of 2)]
[im 1/9]
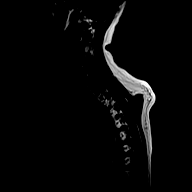
[im 9/9]
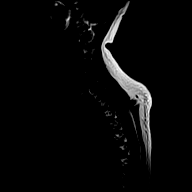

[Series 17: T2 · sagittal · 3.0mm · 1.06mm/px · 6 of 17 slices shown (1 of 2)]
[im 1/17]
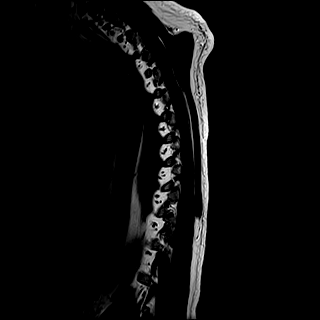
[im 4/17]
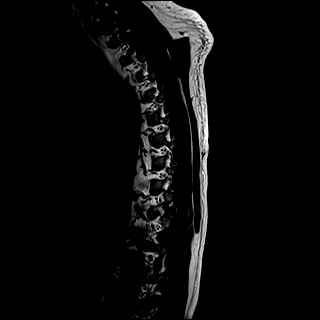
[im 7/17]
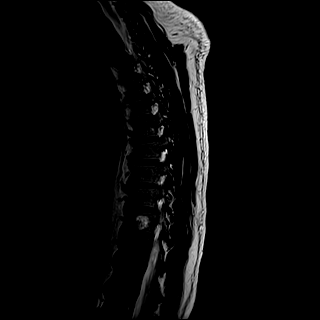
[im 10/17]
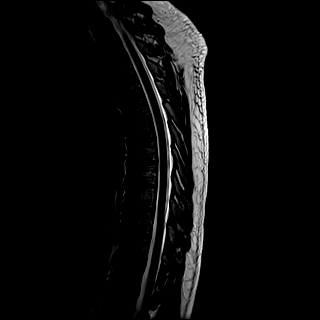
[im 13/17]
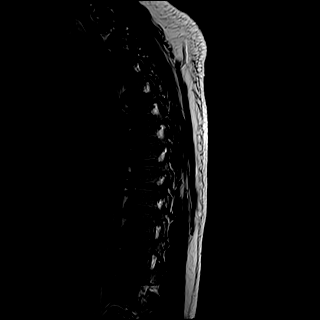
[im 17/17]
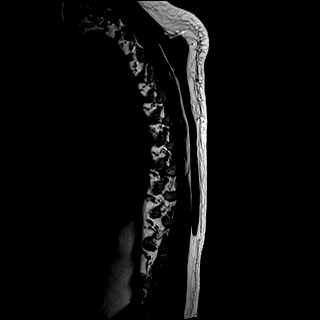

[Series 18: T1 · sagittal · 3.0mm · 1.06mm/px · 6 of 17 slices shown (2 of 2)]
[im 1/17]
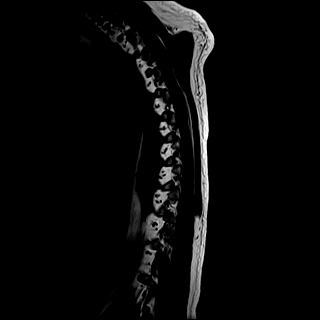
[im 4/17]
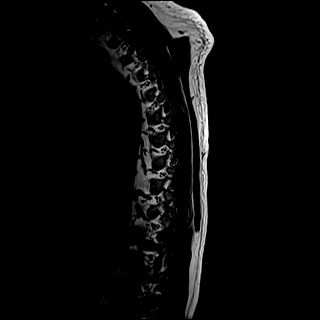
[im 7/17]
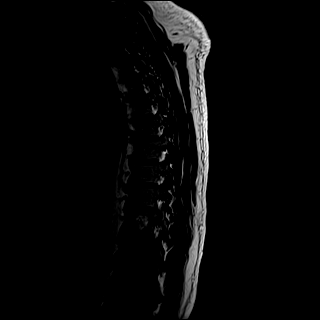
[im 10/17]
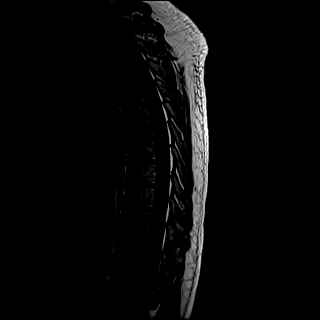
[im 13/17]
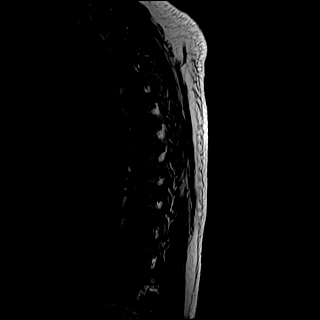
[im 17/17]
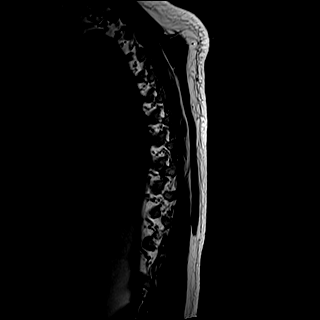

[Series 19: STIR · sagittal · 3.0mm · 0.53mm/px · 6 of 17 slices shown]
[im 1/17]
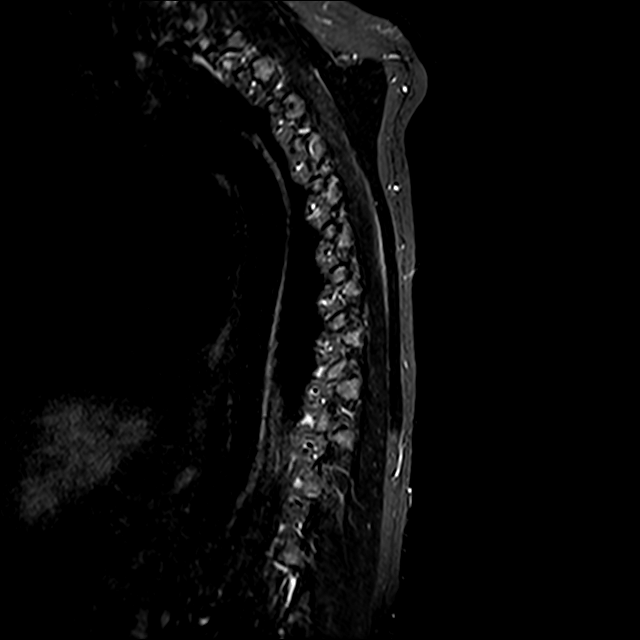
[im 4/17]
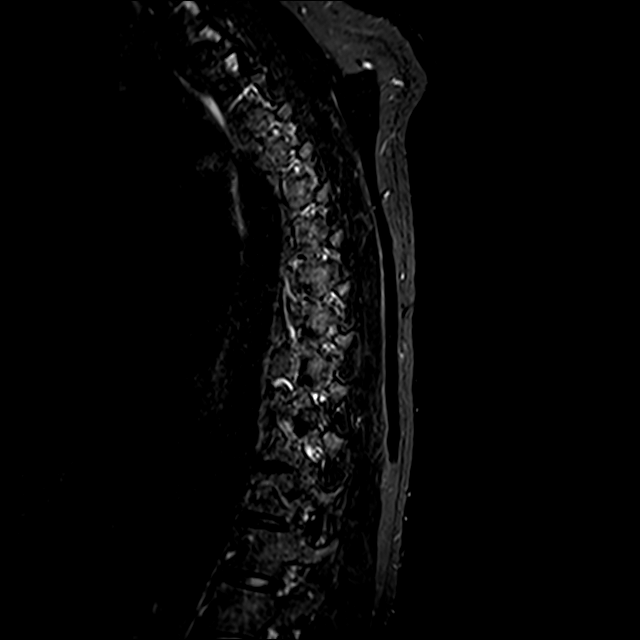
[im 7/17]
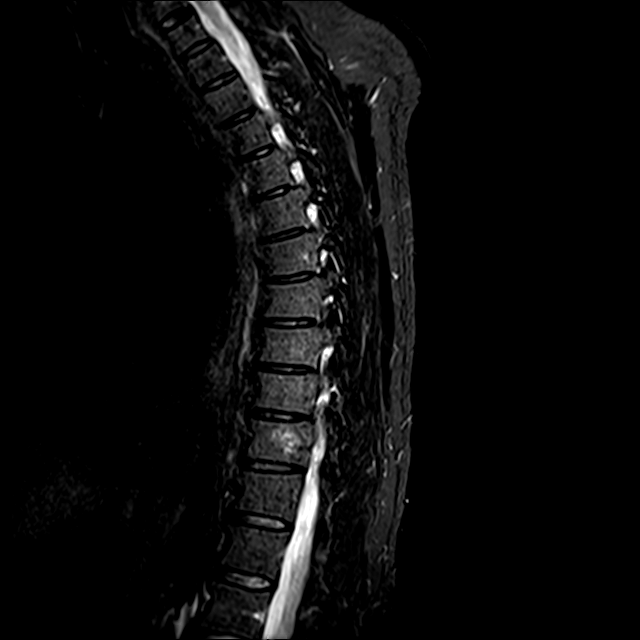
[im 10/17]
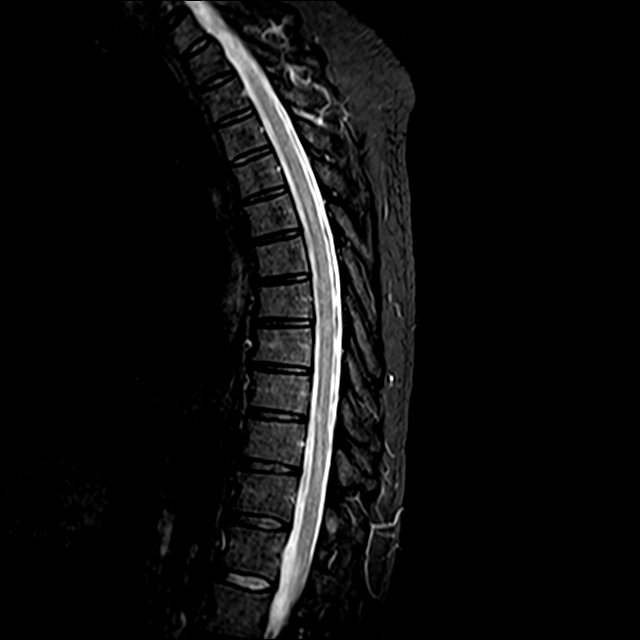
[im 13/17]
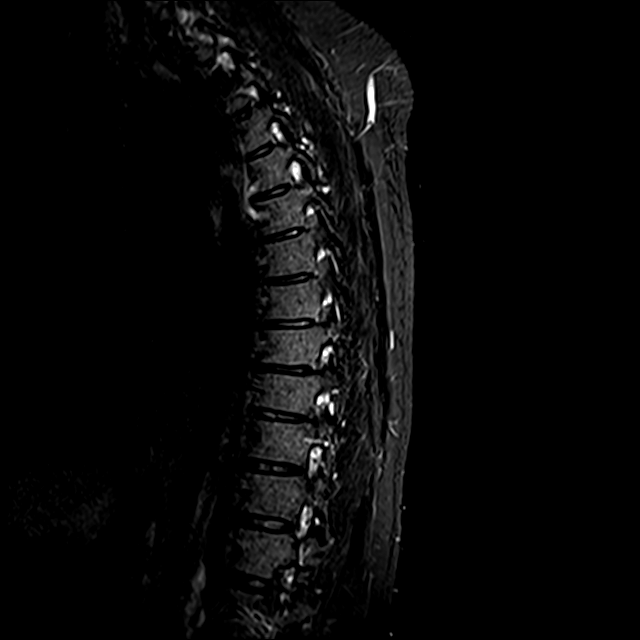
[im 17/17]
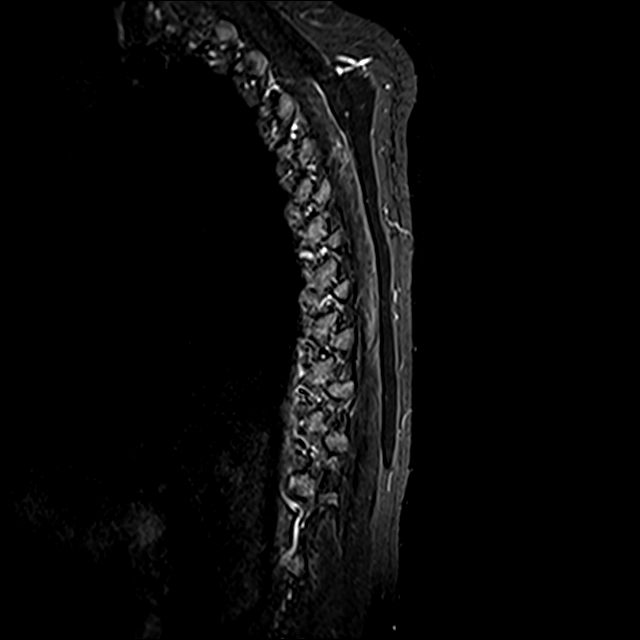

[Series 20: T2 · axial · 4.0mm · 0.59mm/px · z∈[-314,-69]mm · 8 of 39 slices shown (2 of 2)]
[im 1/39]
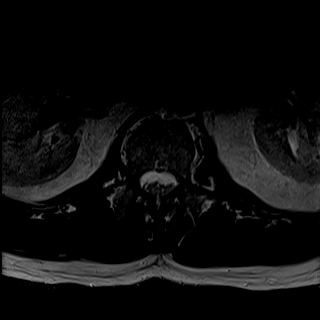
[im 6/39]
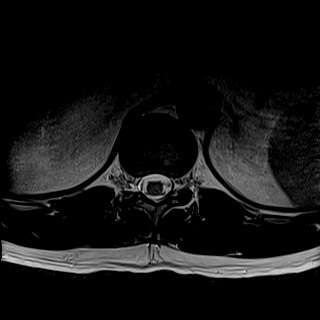
[im 12/39]
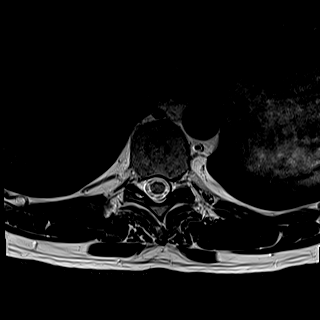
[im 18/39]
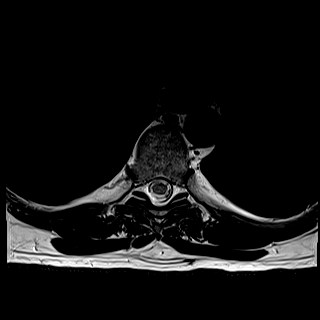
[im 21/39]
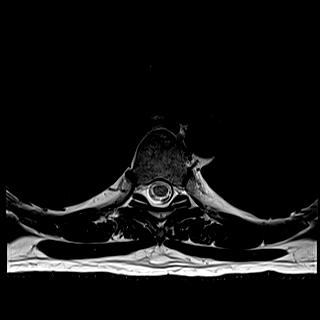
[im 27/39]
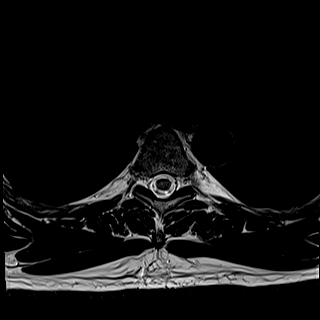
[im 33/39]
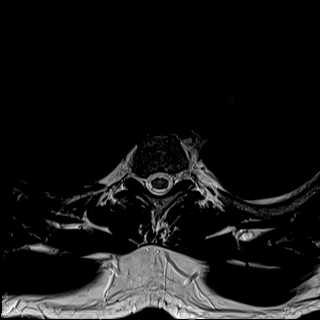
[im 39/39]
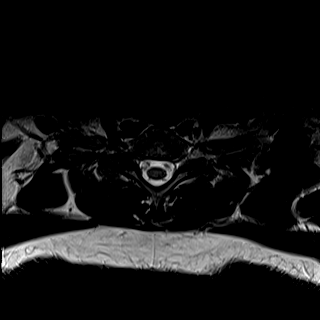

[Series 21: GRE · axial · 4.0mm · 0.37mm/px · z∈[-314,-270]mm · 2 of 39 slices shown]
[im 1/39]
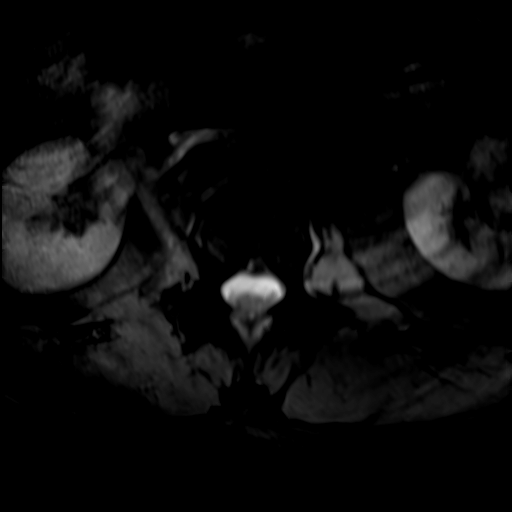
[im 6/39]
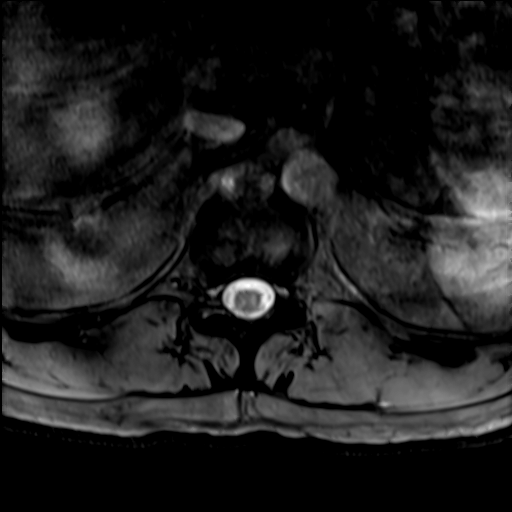

[30 of 48 positions shown; findings below may reference images not displayed]

FINDINGS: Alignment: Vertebral bodies normally aligned with preservation of
the normal thoracic kyphosis. No listhesis.

Vertebrae: Vertebral body heights well maintained without evidence
for acute or chronic fracture. Bone marrow signal intensity normal.
Prominent hemangioma noted within the T10 vertebral body. No
worrisome osseous lesions. No abnormal marrow edema.

Cord:  Signal intensity within the thoracic spinal cord is normal.

Paraspinal and other soft tissues: Paraspinous soft tissues within
normal limits.

Disc levels:

No significant disc pathology seen within the thoracic spine.
Intervertebral discs well hydrated with preserved disc height. No
disc bulge or disc protrusion. No significant facet degeneration. No
canal or foraminal stenosis.
IMPRESSION: Normal MRI of the thoracic spine.

## 2020-10-30 ENCOUNTER — Telehealth: Payer: Self-pay | Admitting: Internal Medicine

## 2020-10-30 NOTE — Telephone Encounter (Signed)
Patient's wife calling in and states that Patient is having new lower back pain and dark/black stool.   Informed that there are no appointments in our office. With these symptoms Patient needs to be seen as soon as possible.   Wife verbalized understanding and will take the Patient to urgent care.   For your information

## 2020-10-31 NOTE — Telephone Encounter (Signed)
For your information  

## 2021-06-14 DIAGNOSIS — S4351XA Sprain of right acromioclavicular joint, initial encounter: Secondary | ICD-10-CM | POA: Diagnosis not present
